# Patient Record
Sex: Female | Born: 2000 | Race: White | Hispanic: No | Marital: Single | Smoking: Never smoker
Health system: Southern US, Community
[De-identification: ages and names within clinical notes are randomized; demographics above are authoritative.]

## PROBLEM LIST (undated history)

## (undated) DIAGNOSIS — F419 Anxiety disorder, unspecified: Secondary | ICD-10-CM

## (undated) DIAGNOSIS — J45909 Unspecified asthma, uncomplicated: Secondary | ICD-10-CM

## (undated) HISTORY — DX: Anxiety disorder, unspecified: F41.9

## (undated) HISTORY — PX: ANTERIOR CRUCIATE LIGAMENT REPAIR: SHX115

---
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
## 2021-11-18 ENCOUNTER — Ambulatory Visit (INDEPENDENT_AMBULATORY_CARE_PROVIDER_SITE_OTHER): Payer: BC Managed Care – PPO | Admitting: Surgery

## 2021-11-18 ENCOUNTER — Encounter: Payer: Self-pay | Admitting: Surgery

## 2021-11-18 VITALS — BP 113/71 | HR 105 | Temp 98.0°F | Ht 62.0 in | Wt 102.0 lb

## 2021-11-18 DIAGNOSIS — N611 Abscess of the breast and nipple: Secondary | ICD-10-CM

## 2021-11-18 NOTE — Progress Notes (Signed)
Spot just medial most inframamillary crease.  About a month ago underwent dermatology injection with steroid and treated with antibiotics.  Apparently that area went completely away was flat seemed to be gone.  However last week she began having some serous drainage from the focus.  Seems today as though it was a drained blister like area.  There is very little to no surrounding erythema; there is a thin skin covering over a centimeter sized base.  There appears to be no cystic content, seems consistent with a drained blister.  We discussed options of observation, or de-blebbing the blisterlike roof and then allowing secondary healing. I do not think excision is a good idea at this point considering the underlying cartilage being so close.  She opted for continued observation which I think is prudent.  We remain readily available should do blebbing or something more aggressive need to be done.  I do not see a role on antibiotics currently.

## 2021-11-18 NOTE — Patient Instructions (Signed)
Continue to watch the area to see if it will heal down on its own.  If the area swells or becomes infected call us to have you come in.    Follow-up with our office as needed.  Please call and ask to speak with a nurse if you develop questions or concerns.

## 2023-09-21 NOTE — Progress Notes (Signed)
 Patient ID: Maria Mcmillan, female   DOB: Oct 21, 2000, 23 y.o.   MRN: [id number]  Chief Complaint: Lesion inferior medial left breast  History of Present Illness Maria Mcmillan is a 24 y.o. female with a and apparently dermal-based lesion of the inferior medial left breast for which I saw this patient a few years ago, previously it seemed to have resolved with an injection of steroid and antibiotics.  And at that time, it was felt continued observation would be a prudent course without getting more aggressive with excision.  However, apparently it has waxed and waned over the interval, getting her attention at times with significant tenderness, increasing size, and pain.  She denies any fevers or chills, has had no drainage from the area.  But it does not seem to go away completely.  Apparently she has been told since then just to continue to manage it medically and symptomatically. She reports her pain is an 8 out of 10.  Past Medical History Past Medical History:  Diagnosis Date   Anxiety    Asthma       Past Surgical History:  Procedure Laterality Date   ANTERIOR CRUCIATE LIGAMENT REPAIR Left     No Known Allergies  Current Outpatient Medications  Medication Sig Dispense Refill   etonogestrel (NEXPLANON) 68 MG IMPL implant Inject into the skin.     fluconazole  (DIFLUCAN ) 150 MG tablet Take 1 tablet (150 mg total) by mouth daily. Take 1 tablet by mouth. Take additional tablet in 7 days if needed. 2 tablet 0   VYVANSE 60 MG capsule Take 60 mg by mouth every morning.     No current facility-administered medications for this visit.    Family History Family History  Problem Relation Age of Onset   Hypertension Mother    Anxiety disorder Mother    Diabetes Father       Social History Social History   Tobacco Use   Smoking status: Never    Passive exposure: Never   Smokeless tobacco: Never  Vaping Use   Vaping status: Never Used  Substance Use Topics   Alcohol use: No         Review of Systems  All other systems reviewed and are negative.    Physical Exam Blood pressure 136/86, pulse 86, temperature 98.6 F (37 C), temperature source Oral, height 5' 2 (1.575 m), weight 140 lb 3.2 oz (63.6 kg), SpO2 98%. Last Weight  Most recent update: 09/23/2023  9:24 AM    Weight  63.6 kg (140 lb 3.2 oz)             CONSTITUTIONAL: Well developed, and nourished, appropriately responsive and aware without distress.   EYES: Sclera non-icteric.   EARS, NOSE, MOUTH AND THROAT: The oropharynx is clear. Oral mucosa is pink and moist.    Hearing is intact to voice.  NECK: Trachea is midline, and there is no jugular venous distension.  LYMPH NODES:  Lymph nodes in the neck are not appreciated. RESPIRATORY:  Lungs are clear, and breath sounds are equal bilaterally.  Normal respiratory effort without pathologic use of accessory muscles. CARDIOVASCULAR: Heart is regular in rate and rhythm.   Well perfused.  GI: The abdomen is  soft, nontender, and nondistended.  GU: Jon present as chaperone there is a 3 cm oval tender indurated lesion of the inferior medial parasternal aspect of the left breast.  There are no other similar lesions elsewhere in the breast no other masses on brief examination.  No evidence of drainage, I would say induration without erythema or cellulitic change. MUSCULOSKELETAL:  Symmetrical muscle tone appreciated in all four extremities.    SKIN: Skin turgor is normal. No pathologic skin lesions appreciated.  NEUROLOGIC:  Motor and sensation appear grossly normal.  Cranial nerves are grossly without defect. PSYCH:  Alert and oriented to person, place and time. Affect is appropriate for situation.  Data Reviewed I have personally reviewed what is currently available of the patient's imaging, recent labs and medical records.   Labs:      No data to display             No data to display          Imaging: Radiological images personally  reviewed:  Within last 24 hrs: No results found.  Assessment    Persisting breast cyst of left breast Patient Active Problem List   Diagnosis Date Noted   Breast abscess of female 11/12/2020    Plan    Excisional biopsy of left breast.  We discussed the risks of anesthesia, bleeding, infection, recurrence.  We discussed the risks of less than optimal cosmesis.  Her desires to eliminate this problem and hopefully not have a recurrence but she understands this is still possible.  Questions answered, both mother and daughter appear to be desiring to proceed, without any guarantees ever expressed or implied.    I personally spent a total of 30 minutes in the care of the patient today including preparing to see the patient, getting/reviewing separately obtained history, performing a medically appropriate exam/evaluation, counseling and educating, documenting clinical information in the EHR, and coordinating care.   These notes generated with voice recognition software. I apologize for typographical errors.  Honor Leghorn M.D., FACS 09/23/2023, 9:59 AM

## 2023-09-21 NOTE — H&P (View-Only) (Signed)
 Patient ID: Maria Mcmillan, female   DOB: Oct 21, 2000, 23 y.o.   MRN: [id number]  Chief Complaint: Lesion inferior medial left breast  History of Present Illness Maria Mcmillan is a 24 y.o. female with a and apparently dermal-based lesion of the inferior medial left breast for which I saw this patient a few years ago, previously it seemed to have resolved with an injection of steroid and antibiotics.  And at that time, it was felt continued observation would be a prudent course without getting more aggressive with excision.  However, apparently it has waxed and waned over the interval, getting her attention at times with significant tenderness, increasing size, and pain.  She denies any fevers or chills, has had no drainage from the area.  But it does not seem to go away completely.  Apparently she has been told since then just to continue to manage it medically and symptomatically. She reports her pain is an 8 out of 10.  Past Medical History Past Medical History:  Diagnosis Date   Anxiety    Asthma       Past Surgical History:  Procedure Laterality Date   ANTERIOR CRUCIATE LIGAMENT REPAIR Left     No Known Allergies  Current Outpatient Medications  Medication Sig Dispense Refill   etonogestrel (NEXPLANON) 68 MG IMPL implant Inject into the skin.     fluconazole  (DIFLUCAN ) 150 MG tablet Take 1 tablet (150 mg total) by mouth daily. Take 1 tablet by mouth. Take additional tablet in 7 days if needed. 2 tablet 0   VYVANSE 60 MG capsule Take 60 mg by mouth every morning.     No current facility-administered medications for this visit.    Family History Family History  Problem Relation Age of Onset   Hypertension Mother    Anxiety disorder Mother    Diabetes Father       Social History Social History   Tobacco Use   Smoking status: Never    Passive exposure: Never   Smokeless tobacco: Never  Vaping Use   Vaping status: Never Used  Substance Use Topics   Alcohol use: No         Review of Systems  All other systems reviewed and are negative.    Physical Exam Blood pressure 136/86, pulse 86, temperature 98.6 F (37 C), temperature source Oral, height 5' 2 (1.575 m), weight 140 lb 3.2 oz (63.6 kg), SpO2 98%. Last Weight  Most recent update: 09/23/2023  9:24 AM    Weight  63.6 kg (140 lb 3.2 oz)             CONSTITUTIONAL: Well developed, and nourished, appropriately responsive and aware without distress.   EYES: Sclera non-icteric.   EARS, NOSE, MOUTH AND THROAT: The oropharynx is clear. Oral mucosa is pink and moist.    Hearing is intact to voice.  NECK: Trachea is midline, and there is no jugular venous distension.  LYMPH NODES:  Lymph nodes in the neck are not appreciated. RESPIRATORY:  Lungs are clear, and breath sounds are equal bilaterally.  Normal respiratory effort without pathologic use of accessory muscles. CARDIOVASCULAR: Heart is regular in rate and rhythm.   Well perfused.  GI: The abdomen is  soft, nontender, and nondistended.  GU: Jon present as chaperone there is a 3 cm oval tender indurated lesion of the inferior medial parasternal aspect of the left breast.  There are no other similar lesions elsewhere in the breast no other masses on brief examination.  No evidence of drainage, I would say induration without erythema or cellulitic change. MUSCULOSKELETAL:  Symmetrical muscle tone appreciated in all four extremities.    SKIN: Skin turgor is normal. No pathologic skin lesions appreciated.  NEUROLOGIC:  Motor and sensation appear grossly normal.  Cranial nerves are grossly without defect. PSYCH:  Alert and oriented to person, place and time. Affect is appropriate for situation.  Data Reviewed I have personally reviewed what is currently available of the patient's imaging, recent labs and medical records.   Labs:      No data to display             No data to display          Imaging: Radiological images personally  reviewed:  Within last 24 hrs: No results found.  Assessment    Persisting breast cyst of left breast Patient Active Problem List   Diagnosis Date Noted   Breast abscess of female 11/12/2020    Plan    Excisional biopsy of left breast.  We discussed the risks of anesthesia, bleeding, infection, recurrence.  We discussed the risks of less than optimal cosmesis.  Her desires to eliminate this problem and hopefully not have a recurrence but she understands this is still possible.  Questions answered, both mother and daughter appear to be desiring to proceed, without any guarantees ever expressed or implied.    I personally spent a total of 30 minutes in the care of the patient today including preparing to see the patient, getting/reviewing separately obtained history, performing a medically appropriate exam/evaluation, counseling and educating, documenting clinical information in the EHR, and coordinating care.   These notes generated with voice recognition software. I apologize for typographical errors.  Honor Leghorn M.D., FACS 09/23/2023, 9:59 AM

## 2023-09-23 ENCOUNTER — Ambulatory Visit: Payer: Self-pay | Admitting: Surgery

## 2023-09-23 ENCOUNTER — Encounter: Payer: Self-pay | Admitting: Surgery

## 2023-09-23 ENCOUNTER — Telehealth: Payer: Self-pay | Admitting: Surgery

## 2023-09-23 VITALS — BP 136/86 | HR 86 | Temp 98.6°F | Ht 62.0 in | Wt 140.2 lb

## 2023-09-23 DIAGNOSIS — N6324 Unspecified lump in the left breast, lower inner quadrant: Secondary | ICD-10-CM

## 2023-09-23 DIAGNOSIS — N6002 Solitary cyst of left breast: Secondary | ICD-10-CM | POA: Diagnosis not present

## 2023-09-23 HISTORY — DX: Unspecified lump in the left breast, lower inner quadrant: N63.24

## 2023-09-23 NOTE — Telephone Encounter (Signed)
 Patient has been advised of Pre-Admission date/time, and Surgery date at [facility].  Surgery Date: 09/29/23 Preadmission Testing Date: 09/27/23 (phone 1p-4p)  Patient informed of the scheduling process and surgery information given at time of office visit.   Patient has been made aware to call [phone], between 1-3:00pm the day before surgery, to find out what time to arrive for surgery.

## 2023-09-23 NOTE — Patient Instructions (Signed)
 We have spoken today about removing a lump in your breast. This will be done by Dr. Lane at [facility].  If you are on any injectable weight loss medication, you will need to stop taking your GLP-1 injectable (weight loss) medications 8 days before your surgery to avoid any complications with anesthesia.   You will most likely be able to leave the hospital several hours after your surgery. Rarely, a patient needs to stay over night but this is a possibility.  Plan to tentatively be off work for 1-2 weeks following the surgery and may return with approximately 2 more weeks of a lifting restriction, no greater than 15 lbs.  Please see your Blue surgery sheet for more information. Our surgery scheduler will call you to look at surgery dates and to go over information.   If you have FMLA or Disability paperwork that needs to be filled out, please have your company fax your paperwork to [phone] or you may drop this by either office. This paperwork will be filled out within 3 days after your surgery has been completed.   Excision of Skin Lesions Excision of a skin lesion is the removal of a section of skin by making small incisions in the skin. Through this process, the lesion is completely removed. This procedure is often done to treat or prevent cancer or infection. It may also be done to improve cosmetic appearance. You may have this procedure to remove: Cancerous (malignant) growths, such as basal cell carcinoma, squamous cell carcinoma, or melanoma. Noncancerous (benign) growths, such as a cyst or lipoma. Growths, such as moles or skin tags, which may be removed for cosmetic reasons. Various excision or surgical techniques may be used depending on your condition, the location of the lesion, and your overall health. Tell your health care provider about: Any allergies you have. All medicines you are taking, including vitamins, herbs, eye drops, creams, and over-the-counter medicines. Any  problems you or family members have had with anesthetic medicines. Any bleeding problems you have. Any surgeries you have had. Any medical conditions you have. Whether you are pregnant or may be pregnant. What are the risks? Generally, this is a safe procedure. However, problems may occur, including: Bleeding. Infection. Scarring. Recurrence of the cyst, lipoma, or cancer. Allergic reaction to anesthetics, surgical materials, or ointments. Damage to nerves, blood vessels, muscles, or other structures. What happens before the procedure? Medicines Ask your health care provider about: Changing or stopping your regular medicines. This is especially important if you are taking diabetes medicines or blood thinners. Taking medicines such as aspirin and ibuprofen . These medicines can thin your blood. Do not take these medicines unless your health care provider tells you to take them. Taking over-the-counter medicines, vitamins, herbs, and supplements. General instructions Do not use any products that contain nicotine or tobacco. These products include cigarettes, chewing tobacco, and vaping devices, such as e-cigarettes. If you need help quitting, ask your health care provider. Follow instructions from your health care provider about eating or drinking restrictions. Ask your health care provider: How your surgery site will be marked. What steps will be taken to help prevent infection. These steps may include: Removing hair at the surgery site. Washing skin with a germ-killing soap. Taking antibiotic medicine. Ask your health care provider if you will need someone to take you home from the hospital or clinic after the procedure. What happens during the procedure?  You will be given a medicine to numb the area (local anesthetic). Your  health care provider will remove the lesions using one of the following excision techniques. Complete surgical excision. This procedure may be done to treat a  cancerous growth or a noncancerous cyst or lesion. A small scalpel or scissors will be used to gently cut around and under the lesion until it is completely removed. If bleeding occurs, it will be stopped with a device that delivers heat (electrocautery). The edges of the wound may be stitched (sutured) together. A bandage (dressing) will be applied. Samples will be sent to a lab for testing. Excision of a cyst. An incision will be made on the cyst. The entire cyst will be removed through the incision. The incision may be closed with sutures. Shave excision. This may be done to remove a mole or other small growths. A small blade or scalpel will be used to shave off the lesion. The wound is usually left to heal on its own without sutures. The sample may be sent to a lab for testing. Punch excision. This may be done to completely remove a mole or other small growths. A small tool that is like a cookie cutter or a hole punch is used to cut a circle shape out of the skin. The outer edges of the skin will be sutured together. The sample may be sent to a lab for testing. Mohs micrographic surgery. This is usually done to treat skin cancer. This type of excision is mostly used on the face and ears. This procedure is minimally invasive, and it ensures the best cosmetic outcome. A scalpel or a loop instrument will be used to remove layers of the lesion until all the abnormal or cancerous tissue has been removed. The wound may be sutured, depending on its size. The tissue will be checked under a microscope right away. The procedure may vary among health care providers and hospitals. At the end of any of these procedures, antibiotic ointment will be applied as needed. What happens after the procedure? Talk with your health care provider to discuss any test results, treatment options, and if necessary, the need for more tests. Keep all follow-up visits. This is important. Summary Excision of a skin  lesion is the removal of a section of skin by making small incisions in the skin. This procedure is often done to treat or prevent skin cancer, remove benign growths, or it may be done to improve cosmetic appearance. Various excision or surgical techniques may be used depending on your condition, the location of the lesion, and your overall health. After the procedure, talk with your health care provider to discuss any test results, treatment options, and if necessary, the need for more tests. Keep all follow-up visits. This is important. This information is not intended to replace advice given to you by your health care provider. Make sure you discuss any questions you have with your health care provider. Document Revised: 08/26/2020 Document Reviewed: 08/27/2020 Elsevier Patient Education  2025 [employer].

## 2023-09-27 ENCOUNTER — Encounter
Admission: RE | Admit: 2023-09-27 | Discharge: 2023-09-27 | Disposition: A | Discharge status: Home / Self Care | Source: Ambulatory Visit | Attending: Surgery | Admitting: Surgery

## 2023-09-27 ENCOUNTER — Other Ambulatory Visit: Payer: Self-pay

## 2023-09-27 VITALS — Ht 62.0 in | Wt 140.0 lb

## 2023-09-27 DIAGNOSIS — Z01812 Encounter for preprocedural laboratory examination: Secondary | ICD-10-CM

## 2023-09-27 NOTE — Patient Instructions (Addendum)
 Your procedure is scheduled on:   [facility]   AUGUST 20  Report to the Registration Desk on the 1st floor of the [facility] To find out your arrival time, please call [phone] between 1PM - 3PM on:   TUESDAY  AUGUST 19 If your arrival time is 6:00 am, do not arrive before that time as the [facility] entrance doors do not open until 6:00 am.  REMEMBER: Instructions that are not followed completely may result in serious medical risk, up to and including death; or upon the discretion of your surgeon and anesthesiologist your surgery may need to be rescheduled.  Do not eat food after midnight the night before surgery.  No gum chewing or hard candies.   One week prior to surgery: Stop Anti-inflammatories (NSAIDS) such as Advil , Aleve, Ibuprofen , Motrin , Naproxen, Naprosyn and Aspirin based products such as Excedrin, Goody's Powder, BC Powder. Stop ANY OVER THE COUNTER supplements until after surgery.  You may however, continue to take Tylenol  if needed for pain up until the day of surgery.  Continue taking all of your other prescription medications up until the day of surgery.  ON THE DAY OF SURGERY DO NOT TAKE ANY MEDICATIONS   No Alcohol for 24 hours before or after surgery.  No Smoking including e-cigarettes for 24 hours before surgery.   Do not use any recreational drugs for at least a week (preferably 2 weeks) before your surgery.  Please be advised that the combination of cocaine and anesthesia may have negative outcomes, up to and including death. If you test positive for cocaine, your surgery will be cancelled.  On the morning of surgery brush your teeth with toothpaste and water , you may rinse your mouth with mouthwash if you wish. Do not swallow any toothpaste or mouthwash.  Use CHG Soap as directed on instruction sheet.  Do not wear jewelry, make-up, hairpins, clips or nail polish.  For welded (permanent) jewelry: bracelets, anklets, waist bands, etc.  Please  have this removed prior to surgery.  If it is not removed, there is a chance that hospital personnel will need to cut it off on the day of surgery.  Do not wear lotions, powders, or perfumes.   Do not shave body hair from the neck down 48 hours before surgery.  Do not bring valuables to the hospital. [facility] is not responsible for any missing/lost belongings or valuables.   Notify your doctor if there is any change in your medical condition (cold, fever, infection).  Wear comfortable clothing (specific to your surgery type) to the hospital.  After surgery, you can help prevent lung complications by doing breathing exercises.  Take deep breaths and cough every 1-2 hours.   If you are being discharged the day of surgery, you will not be allowed to drive home. You will need a responsible individual to drive you home and stay with you for 24 hours after surgery.   If you are taking public transportation, you will need to have a responsible individual with you.  Please call the Pre-admissions Testing Dept. at [phone] if you have any questions about these instructions.  Surgery Visitation Policy:  Patients having surgery or a procedure may have two visitors.  Children under the age of 81 must have an adult with them who is not the patient.  Merchandiser, retail to address health-related social needs:  [email or web address]        Preparing for Surgery with CHLORHEXIDINE  GLUCONATE (CHG) Soap  Chlorhexidine  Gluconate (  CHG) Soap  o An antiseptic cleaner that kills germs and bonds with the skin to continue killing germs even after washing  o Used for showering the night before surgery and morning of surgery  Before surgery, you can play an important role by reducing the number of germs on your skin.  CHG (Chlorhexidine  gluconate) soap is an antiseptic cleanser which kills germs and bonds with the skin to continue killing germs even after  washing.  Please do not use if you have an allergy to CHG or antibacterial soaps. If your skin becomes reddened/irritated stop using the CHG.  1. Shower the NIGHT BEFORE SURGERY and the MORNING OF SURGERY with CHG soap.  2. If you choose to wash your hair, wash your hair first as usual with your normal shampoo.  3. After shampooing, rinse your hair and body thoroughly to remove the shampoo.  4. Use CHG as you would any other liquid soap. You can apply CHG directly to the skin and wash gently with a scrungie or a clean washcloth.  5. Apply the CHG soap to your body only from the neck down. Do not use on open wounds or open sores. Avoid contact with your eyes, ears, mouth, and genitals (private parts). Wash face and genitals (private parts) with your normal soap.  6. Wash thoroughly, paying special attention to the area where your surgery will be performed.  7. Thoroughly rinse your body with warm water .  8. Do not shower/wash with your normal soap after using and rinsing off the CHG soap.  9. Pat yourself dry with a clean towel.  10. Wear clean pajamas to bed the night before surgery.  11. Place clean sheets on your bed the night of your first shower and do not sleep with pets.  12. Shower again with the CHG soap on the day of surgery prior to arriving at the hospital.  13. Do not apply any deodorants/lotions/powders.  14. Please wear clean clothes to the hospital.

## 2023-09-29 ENCOUNTER — Other Ambulatory Visit: Payer: Self-pay

## 2023-09-29 ENCOUNTER — Ambulatory Visit: Payer: Self-pay | Admitting: Urgent Care

## 2023-09-29 ENCOUNTER — Encounter: Payer: Self-pay | Admitting: Surgery

## 2023-09-29 ENCOUNTER — Encounter
Admission: RE | Disposition: A | Discharge status: Home / Self Care | Payer: Self-pay | Source: Home / Self Care | Attending: Surgery

## 2023-09-29 ENCOUNTER — Ambulatory Visit: Admitting: General Practice

## 2023-09-29 ENCOUNTER — Ambulatory Visit
Admission: RE | Admit: 2023-09-29 | Discharge: 2023-09-29 | Disposition: A | Discharge status: Home / Self Care | Attending: Surgery | Admitting: Surgery

## 2023-09-29 DIAGNOSIS — N6002 Solitary cyst of left breast: Secondary | ICD-10-CM | POA: Diagnosis not present

## 2023-09-29 DIAGNOSIS — Z01812 Encounter for preprocedural laboratory examination: Secondary | ICD-10-CM

## 2023-09-29 DIAGNOSIS — L988 Other specified disorders of the skin and subcutaneous tissue: Secondary | ICD-10-CM | POA: Diagnosis present

## 2023-09-29 DIAGNOSIS — N6324 Unspecified lump in the left breast, lower inner quadrant: Secondary | ICD-10-CM

## 2023-09-29 HISTORY — PX: EXCISION OF BREAST BIOPSY: SHX5822

## 2023-09-29 LAB — POCT PREGNANCY, URINE: Preg Test, Ur: NEGATIVE

## 2023-09-29 SURGERY — EXCISION OF BREAST BIOPSY
Anesthesia: General | Site: Breast | Laterality: Left

## 2023-09-29 MED ORDER — HYDROCODONE-ACETAMINOPHEN 5-325 MG PO TABS: 1.0000 | ORAL_TABLET | Freq: Four times a day (QID) | ORAL | 0 refills | Status: AC | PRN

## 2023-09-29 MED ORDER — ONDANSETRON HCL 4 MG/2ML IJ SOLN
INTRAMUSCULAR | Status: DC | PRN
  Administered 2023-09-29 (×2): 4 mg via INTRAVENOUS

## 2023-09-29 MED ORDER — MIDAZOLAM HCL 2 MG/2ML IJ SOLN
INTRAMUSCULAR | Status: DC | PRN
  Administered 2023-09-29 (×2): 1 mg via INTRAVENOUS
  Administered 2023-09-29: 2 mg via INTRAVENOUS

## 2023-09-29 MED ORDER — BUPIVACAINE LIPOSOME 1.3 % IJ SUSP: 20.0000 mL | Freq: Once | INTRAMUSCULAR | Status: DC

## 2023-09-29 MED ORDER — BUPIVACAINE-EPINEPHRINE 0.25% -1:200000 IJ SOLN
INTRAMUSCULAR | Status: DC | PRN
  Administered 2023-09-29: 10 mL

## 2023-09-29 MED ORDER — CHLORHEXIDINE GLUCONATE CLOTH 2 % EX PADS
6.0000 | MEDICATED_PAD | Freq: Once | CUTANEOUS | Status: AC
  Administered 2023-09-29: 6 via TOPICAL

## 2023-09-29 MED ORDER — ACETAMINOPHEN 500 MG PO TABS
1000.0000 mg | ORAL_TABLET | ORAL | Status: AC
  Administered 2023-09-29: 1000 mg via ORAL

## 2023-09-29 MED ORDER — GABAPENTIN 300 MG PO CAPS
ORAL_CAPSULE | ORAL | Status: AC
  Filled 2023-09-29: qty 1

## 2023-09-29 MED ORDER — OXYCODONE HCL 5 MG PO TABS
5.0000 mg | ORAL_TABLET | Freq: Once | ORAL | Status: AC | PRN
  Administered 2023-09-29: 5 mg via ORAL

## 2023-09-29 MED ORDER — PROPOFOL 1000 MG/100ML IV EMUL
INTRAVENOUS | Status: AC
  Filled 2023-09-29: qty 100

## 2023-09-29 MED ORDER — MIDAZOLAM HCL 2 MG/2ML IJ SOLN
INTRAMUSCULAR | Status: AC
  Filled 2023-09-29: qty 2

## 2023-09-29 MED ORDER — GABAPENTIN 300 MG PO CAPS
300.0000 mg | ORAL_CAPSULE | ORAL | Status: AC
  Administered 2023-09-29: 300 mg via ORAL

## 2023-09-29 MED ORDER — CHLORHEXIDINE GLUCONATE 0.12 % MT SOLN
OROMUCOSAL | Status: AC
  Filled 2023-09-29: qty 15

## 2023-09-29 MED ORDER — BUPIVACAINE-EPINEPHRINE (PF) 0.25% -1:200000 IJ SOLN
INTRAMUSCULAR | Status: AC
  Filled 2023-09-29: qty 30

## 2023-09-29 MED ORDER — PROPOFOL 10 MG/ML IV BOLUS
INTRAVENOUS | Status: DC | PRN
  Administered 2023-09-29: 200 mg via INTRAVENOUS

## 2023-09-29 MED ORDER — LACTATED RINGERS IV SOLN: INTRAVENOUS | Status: DC

## 2023-09-29 MED ORDER — OXYCODONE HCL 5 MG/5ML PO SOLN: 5.0000 mg | Freq: Once | ORAL | Status: AC | PRN

## 2023-09-29 MED ORDER — FENTANYL CITRATE (PF) 100 MCG/2ML IJ SOLN
INTRAMUSCULAR | Status: DC | PRN
  Administered 2023-09-29 (×2): 50 ug via INTRAVENOUS

## 2023-09-29 MED ORDER — FENTANYL CITRATE (PF) 100 MCG/2ML IJ SOLN
INTRAMUSCULAR | Status: AC
  Filled 2023-09-29: qty 2

## 2023-09-29 MED ORDER — DEXMEDETOMIDINE HCL IN NACL 200 MCG/50ML IV SOLN
INTRAVENOUS | Status: DC | PRN
  Administered 2023-09-29 (×2): 12 ug via INTRAVENOUS

## 2023-09-29 MED ORDER — LIDOCAINE HCL (CARDIAC) PF 100 MG/5ML IV SOSY
PREFILLED_SYRINGE | INTRAVENOUS | Status: DC | PRN
  Administered 2023-09-29: 100 mg via INTRAVENOUS

## 2023-09-29 MED ORDER — CELECOXIB 200 MG PO CAPS
ORAL_CAPSULE | ORAL | Status: AC
  Filled 2023-09-29: qty 1

## 2023-09-29 MED ORDER — FENTANYL CITRATE (PF) 100 MCG/2ML IJ SOLN: 25.0000 ug | INTRAMUSCULAR | Status: DC | PRN

## 2023-09-29 MED ORDER — CHLORHEXIDINE GLUCONATE CLOTH 2 % EX PADS: 6.0000 | MEDICATED_PAD | Freq: Once | CUTANEOUS | Status: DC

## 2023-09-29 MED ORDER — DEXAMETHASONE SODIUM PHOSPHATE 10 MG/ML IJ SOLN
INTRAMUSCULAR | Status: DC | PRN
  Administered 2023-09-29: 10 mg via INTRAVENOUS

## 2023-09-29 MED ORDER — CEFAZOLIN SODIUM-DEXTROSE 2-4 GM/100ML-% IV SOLN
INTRAVENOUS | Status: AC
Start: 2023-09-29 — End: 2023-09-29
  Filled 2023-09-29: qty 100

## 2023-09-29 MED ORDER — CEFAZOLIN SODIUM-DEXTROSE 2-4 GM/100ML-% IV SOLN
2.0000 g | INTRAVENOUS | Status: AC
  Administered 2023-09-29: 2 g via INTRAVENOUS

## 2023-09-29 MED ORDER — CELECOXIB 200 MG PO CAPS
200.0000 mg | ORAL_CAPSULE | ORAL | Status: AC
  Administered 2023-09-29: 200 mg via ORAL

## 2023-09-29 MED ORDER — GLYCOPYRROLATE 0.2 MG/ML IJ SOLN
INTRAMUSCULAR | Status: DC | PRN
  Administered 2023-09-29: .2 mg via INTRAVENOUS

## 2023-09-29 MED ORDER — CHLORHEXIDINE GLUCONATE 0.12 % MT SOLN
15.0000 mL | Freq: Once | OROMUCOSAL | Status: AC
  Administered 2023-09-29: 15 mL via OROMUCOSAL

## 2023-09-29 MED ORDER — OXYCODONE HCL 5 MG PO TABS
ORAL_TABLET | ORAL | Status: AC
Start: 2023-09-29 — End: 2023-09-29
  Filled 2023-09-29: qty 1

## 2023-09-29 MED ORDER — ORAL CARE MOUTH RINSE: 15.0000 mL | Freq: Once | OROMUCOSAL | Status: AC

## 2023-09-29 MED ORDER — ACETAMINOPHEN 500 MG PO TABS
ORAL_TABLET | ORAL | Status: AC
Start: 2023-09-29 — End: 2023-09-29
  Filled 2023-09-29: qty 2

## 2023-09-29 MED ORDER — STERILE WATER FOR IRRIGATION IR SOLN
Status: DC | PRN
  Administered 2023-09-29: 500 mL

## 2023-09-29 SURGICAL SUPPLY — 26 items
BLADE SURG 15 STRL LF DISP TIS (BLADE) ×1 IMPLANT
CHLORAPREP W/TINT 26 (MISCELLANEOUS) IMPLANT
CLIP APPLIE 9.375 SM OPEN (CLIP) IMPLANT
DERMABOND ADVANCED .7 DNX12 (GAUZE/BANDAGES/DRESSINGS) ×1 IMPLANT
DEVICE DUBIN SPECIMEN MAMMOGRA (MISCELLANEOUS) IMPLANT
DRAPE LAPAROTOMY TRNSV 106X77 (MISCELLANEOUS) ×1 IMPLANT
ELECTRODE CAUTERY BLDE TIP 2.5 (TIP) ×1 IMPLANT
ELECTRODE REM PT RTRN 9FT ADLT (ELECTROSURGICAL) ×1 IMPLANT
GAUZE 4X4 16PLY ~~LOC~~+RFID DBL (SPONGE) IMPLANT
GLOVE ORTHO TXT STRL SZ7.5 (GLOVE) ×1 IMPLANT
GOWN STRL REUS W/ TWL LRG LVL3 (GOWN DISPOSABLE) ×1 IMPLANT
GOWN STRL REUS W/ TWL XL LVL3 (GOWN DISPOSABLE) ×1 IMPLANT
KIT MARKER MARGIN INK (KITS) IMPLANT
KIT TURNOVER KIT A (KITS) ×1 IMPLANT
MANIFOLD NEPTUNE II (INSTRUMENTS) ×1 IMPLANT
MARKER MARGIN CORRECT CLIP (MARKER) IMPLANT
NDL HYPO 22X1.5 SAFETY MO (MISCELLANEOUS) ×1 IMPLANT
NEEDLE HYPO 22X1.5 SAFETY MO (MISCELLANEOUS) ×1 IMPLANT
PACK BASIN MINOR ARMC (MISCELLANEOUS) ×1 IMPLANT
SPIKE FLUID TRANSFER (MISCELLANEOUS) ×1 IMPLANT
SUT VIC AB 3-0 SH 27X BRD (SUTURE) ×1 IMPLANT
SUTURE MNCRL 4-0 27XMF (SUTURE) ×1 IMPLANT
SYR 10ML LL (SYRINGE) ×1 IMPLANT
TRAP FLUID SMOKE EVACUATOR (MISCELLANEOUS) ×1 IMPLANT
TRAP NEPTUNE SPECIMEN COLLECT (MISCELLANEOUS) ×1 IMPLANT
WATER STERILE IRR 500ML POUR (IV SOLUTION) ×1 IMPLANT

## 2023-09-29 NOTE — Transfer of Care (Signed)
 Immediate Anesthesia Transfer of Care Note  Patient: [name]  Procedure(s) Performed: EXCISION OF BREAST BIOPSY (Left: Breast)  Patient Location: PACU  Anesthesia Type:General  Level of Consciousness: drowsy and patient cooperative  Airway & Oxygen Therapy: Patient Spontanous Breathing and Patient connected to face mask oxygen  Post-op Assessment: Report given to RN and Post -op Vital signs reviewed and stable  Post vital signs: Reviewed and stable  Last Vitals:  Vitals Value Taken Time  BP    Temp    Pulse 79 09/29/23 08:14  Resp 23 09/29/23 08:14  SpO2 100 % 09/29/23 08:14  Vitals shown include unfiled device data.  Last Pain:  Vitals:   09/29/23 0623  TempSrc: Temporal  PainSc: 0-No pain         Complications: No notable events documented.

## 2023-09-29 NOTE — Anesthesia Procedure Notes (Signed)
 Procedure Name: LMA Insertion Date/Time: 09/29/2023 7:36 AM  Performed by: Ledora Duncan, CRNAPre-anesthesia Checklist: Patient identified, Emergency Drugs available, Suction available and Patient being monitored Patient Re-evaluated:Patient Re-evaluated prior to induction Oxygen Delivery Method: Circle system utilized Preoxygenation: Pre-oxygenation with 100% oxygen Induction Type: IV induction Ventilation: Mask ventilation without difficulty LMA: LMA inserted LMA Size: 3.0 Placement Confirmation: positive ETCO2 and breath sounds checked- equal and bilateral Tube secured with: Tape Dental Injury: Teeth and Oropharynx as per pre-operative assessment

## 2023-09-29 NOTE — Op Note (Signed)
 Excisional left breast biopsy, inferior medial quadrant.  Pre-operative Diagnosis: Left breast, superficial lesion involving skin.  Post-operative Diagnosis: same.    Surgeon: Honor Leghorn, M.D., FACS  Anesthesia: General  Findings: Consistent with inflammatory process, full excision completed to normal adjacent soft tissues and skin.  Estimated Blood Loss: 3 mL         Specimens: Skin, subcutaneous tissue left inferior medial breast.          Complications: none              Procedure Details  The patient was seen again in the Holding Room. The benefits, complications, treatment options, and expected outcomes were discussed with the patient. The risks of bleeding, infection, recurrence of symptoms, failure to resolve symptoms, unanticipated injury, prosthetic placement, prosthetic infection, any of which could require further surgery were reviewed with the patient. The likelihood of improving the patient's symptoms with return to their baseline status is expected.  The patient and/or family concurred with the proposed plan, giving informed consent.  The patient was taken to Operating Room, identified and the procedure verified.    Prior to the induction of general anesthesia, antibiotic prophylaxis was administered. VTE prophylaxis was in place.  General anesthesia was then administered and tolerated well. After the induction, the patient was positioned in the supine position and the left breast was prepped with Betadine and Chloraprep and draped in the sterile fashion.  A Time Out was held and the above information confirmed.  Local infiltration with quarter percent Marcaine  with epinephrine  is utilized to surrounding this lesion and inferior medial left breast, skin incision was made elliptically to ensure removal of the entirety of the dermal inflammatory change.  This was continued through the subcutaneous soft tissues with sufficient width to ensure were staying within the adjacent  soft tissue.  With the lesion fully mobilized and removed, it was sent for permanent section.  Wound irrigation was completed, hemostasis confirmed.  Incision was then closed in 2 layers with 3-0 Vicryl dermal sutures followed by running 4-0 Monocryl subcuticular.  The dressing applied was Dermabond.  She tolerated procedure well, was awakened and taken to recovery room in stable condition.      Honor Leghorn M.D., [facility] 09/29/2023 8:28 AM

## 2023-09-29 NOTE — Interval H&P Note (Signed)
 History and Physical Interval Note:  09/29/2023 7:31 AM  Maria Mcmillan  has presented today for surgery, with the diagnosis of Breast lesion left.  The various methods of treatment have been discussed with the patient and family. After consideration of risks, benefits and other options for treatment, the patient has consented to  Procedure(s): EXCISION OF BREAST BIOPSY (Left) as a surgical intervention.  The patient's history has been reviewed, patient examined, no change in status, stable for surgery.  I have reviewed the patient's chart and labs.  Questions were answered to the patient's satisfaction.   The left upper breast is marked.   Honor Leghorn

## 2023-09-29 NOTE — Anesthesia Preprocedure Evaluation (Signed)
 Anesthesia Evaluation  Patient identified by MRN, date of birth, ID band Patient awake    Reviewed: Allergy & Precautions, NPO status , Patient's Chart, lab work & pertinent test results  Airway Mallampati: III  TM Distance: >3 FB Neck ROM: full    Dental  (+) Chipped   Pulmonary asthma    Pulmonary exam normal        Cardiovascular negative cardio ROS Normal cardiovascular exam     Neuro/Psych negative neurological ROS  negative psych ROS   GI/Hepatic negative GI ROS, Neg liver ROS,,,  Endo/Other  negative endocrine ROS    Renal/GU negative Renal ROS  negative genitourinary   Musculoskeletal   Abdominal   Peds  Hematology negative hematology ROS (+)   Anesthesia Other Findings Past Medical History: No date: Anxiety No date: Asthma 11/12/2020: Breast abscess of female 09/23/2023: Mass of lower inner quadrant of left breast  Past Surgical History: No date: ANTERIOR CRUCIATE LIGAMENT REPAIR; Left     Reproductive/Obstetrics negative OB ROS                              Anesthesia Physical Anesthesia Plan  ASA: 2  Anesthesia Plan: General   Post-op Pain Management:    Induction: Intravenous  PONV Risk Score and Plan: 3 and Ondansetron , Dexamethasone  and Midazolam   Airway Management Planned: LMA  Additional Equipment:   Intra-op Plan:   Post-operative Plan: Extubation in OR  Informed Consent: I have reviewed the patients History and Physical, chart, labs and discussed the procedure including the risks, benefits and alternatives for the proposed anesthesia with the patient or authorized representative who has indicated his/her understanding and acceptance.     Dental Advisory Given  Plan Discussed with: Anesthesiologist, CRNA and Surgeon  Anesthesia Plan Comments: (Patient consented for risks of anesthesia including but not limited to:  - adverse reactions to  medications - damage to eyes, teeth, lips or other oral mucosa - nerve damage due to positioning  - sore throat or hoarseness - Damage to heart, brain, nerves, lungs, other parts of body or loss of life  Patient voiced understanding and assent.)        Anesthesia Quick Evaluation

## 2023-09-30 ENCOUNTER — Encounter: Payer: Self-pay | Admitting: Surgery

## 2023-09-30 LAB — SURGICAL PATHOLOGY

## 2023-09-30 NOTE — Anesthesia Postprocedure Evaluation (Signed)
 Anesthesia Post Note  Patient: Maria Mcmillan  Procedure(s) Performed: EXCISION OF BREAST BIOPSY (Left: Breast)  Patient location during evaluation: PACU Anesthesia Type: General Level of consciousness: awake and alert Pain management: pain level controlled Vital Signs Assessment: post-procedure vital signs reviewed and stable Respiratory status: spontaneous breathing, nonlabored ventilation and respiratory function stable Cardiovascular status: blood pressure returned to baseline and stable Postop Assessment: no apparent nausea or vomiting Anesthetic complications: no   No notable events documented.   Last Vitals:  Vitals:   09/29/23 0845 09/29/23 0855  BP: 120/89 113/69  Pulse: 64 73  Resp: 11 12  Temp: 36.7 C 36.6 C  SpO2: 100% 100%    Last Pain:  Vitals:   09/30/23 0830  TempSrc:   PainSc: 1                  Camellia Merilee Louder

## 2023-10-12 ENCOUNTER — Ambulatory Visit (INDEPENDENT_AMBULATORY_CARE_PROVIDER_SITE_OTHER): Payer: PRIVATE HEALTH INSURANCE | Admitting: Surgery

## 2023-10-12 ENCOUNTER — Encounter: Payer: Self-pay | Admitting: Surgery

## 2023-10-12 VITALS — BP 122/82 | HR 59 | Temp 98.3°F | Ht 62.0 in | Wt 142.0 lb

## 2023-10-12 DIAGNOSIS — Z09 Encounter for follow-up examination after completed treatment for conditions other than malignant neoplasm: Secondary | ICD-10-CM

## 2023-10-12 DIAGNOSIS — N611 Abscess of the breast and nipple: Secondary | ICD-10-CM

## 2023-10-12 DIAGNOSIS — N6002 Solitary cyst of left breast: Secondary | ICD-10-CM

## 2023-10-12 NOTE — Progress Notes (Signed)
 [facility] POST-OP OFFICE VISIT  10/12/2023  HPI: Maria Mcmillan is a 23 y.o. female had surgery on  September 29, 2023 , now s/p excision of dermal cystic lesion of the left breast.  She has no issues whatsoever, denies drainage, denies pain or tenderness.  Denies any fevers or chills.  Happy with cosmetic outcome.  Vital signs: BP 122/82   Pulse (!) 59   Temp 98.3 F (36.8 C) (Oral)   Ht 5' 2 (1.575 m)   Wt 142 lb (64.4 kg)   LMP  (LMP Unknown)   SpO2 98%   BMI 25.97 kg/m    Physical Exam: Constitutional: She appears well.  Skin: Clean scar of inferior medial left breast.  No evidence of reaction to sutures, no residual induration.  Assessment/Plan: This is a 23 y.o. female who on  September 29, 2023 , now s/p excision of dermal cystic lesion of the left breast.    Patient Active Problem List   Diagnosis Date Noted   Mass of lower inner quadrant of left breast 09/23/2023   Breast abscess of female 11/12/2020    - Progressing well, appears to be healing nicely.  Happy with result.  Will have her return as needed.   Honor Leghorn M.D., FACS 10/12/2023, 9:22 AM

## 2023-10-12 NOTE — Patient Instructions (Signed)
